# Patient Record
Sex: Female | Born: 1940 | Race: White | Hispanic: No | Smoking: Never smoker
Health system: Southern US, Community
[De-identification: ages and names within clinical notes are randomized; demographics above are authoritative.]

## PROBLEM LIST (undated history)

## (undated) DIAGNOSIS — J302 Other seasonal allergic rhinitis: Secondary | ICD-10-CM

## (undated) DIAGNOSIS — Z974 Presence of external hearing-aid: Secondary | ICD-10-CM

## (undated) DIAGNOSIS — M8580 Other specified disorders of bone density and structure, unspecified site: Secondary | ICD-10-CM

## (undated) DIAGNOSIS — I10 Essential (primary) hypertension: Secondary | ICD-10-CM

## (undated) HISTORY — PX: FOOT SURGERY: SHX648

## (undated) HISTORY — PX: VAGINA SURGERY: SHX829

## (undated) HISTORY — PX: CHOLECYSTECTOMY: SHX55

---

## 1998-11-21 ENCOUNTER — Other Ambulatory Visit: Admission: RE | Admit: 1998-11-21 | Discharge: 1998-11-21 | Payer: Self-pay | Admitting: Obstetrics & Gynecology

## 2000-01-01 ENCOUNTER — Other Ambulatory Visit: Admission: RE | Admit: 2000-01-01 | Discharge: 2000-01-01 | Payer: Self-pay | Admitting: Obstetrics & Gynecology

## 2000-01-24 ENCOUNTER — Ambulatory Visit (HOSPITAL_COMMUNITY): Admission: RE | Admit: 2000-01-24 | Discharge: 2000-01-24 | Payer: Self-pay | Admitting: Obstetrics & Gynecology

## 2000-03-04 ENCOUNTER — Ambulatory Visit (HOSPITAL_COMMUNITY): Admission: RE | Admit: 2000-03-04 | Discharge: 2000-03-04 | Payer: Self-pay | Admitting: Gastroenterology

## 2001-01-01 ENCOUNTER — Other Ambulatory Visit: Admission: RE | Admit: 2001-01-01 | Discharge: 2001-01-01 | Payer: Self-pay | Admitting: Obstetrics & Gynecology

## 2002-01-17 ENCOUNTER — Other Ambulatory Visit: Admission: RE | Admit: 2002-01-17 | Discharge: 2002-01-17 | Payer: Self-pay | Admitting: Obstetrics & Gynecology

## 2003-02-01 ENCOUNTER — Other Ambulatory Visit: Admission: RE | Admit: 2003-02-01 | Discharge: 2003-02-01 | Payer: Self-pay | Admitting: Obstetrics & Gynecology

## 2004-02-13 ENCOUNTER — Other Ambulatory Visit: Admission: RE | Admit: 2004-02-13 | Discharge: 2004-02-13 | Payer: Self-pay | Admitting: Obstetrics & Gynecology

## 2004-02-21 ENCOUNTER — Encounter: Admission: RE | Admit: 2004-02-21 | Discharge: 2004-02-21 | Payer: Self-pay | Admitting: Obstetrics & Gynecology

## 2005-07-07 ENCOUNTER — Other Ambulatory Visit: Admission: RE | Admit: 2005-07-07 | Discharge: 2005-07-07 | Payer: Self-pay | Admitting: Obstetrics & Gynecology

## 2010-05-20 ENCOUNTER — Ambulatory Visit: Payer: Self-pay | Admitting: Gastroenterology

## 2011-06-19 ENCOUNTER — Other Ambulatory Visit: Payer: Self-pay | Admitting: Obstetrics & Gynecology

## 2011-06-19 DIAGNOSIS — R928 Other abnormal and inconclusive findings on diagnostic imaging of breast: Secondary | ICD-10-CM

## 2011-07-01 ENCOUNTER — Ambulatory Visit
Admission: RE | Admit: 2011-07-01 | Discharge: 2011-07-01 | Disposition: A | Discharge status: Home / Self Care | Payer: Medicare Other | Source: Ambulatory Visit | Attending: Obstetrics & Gynecology | Admitting: Obstetrics & Gynecology

## 2011-07-01 DIAGNOSIS — R928 Other abnormal and inconclusive findings on diagnostic imaging of breast: Secondary | ICD-10-CM

## 2011-09-05 ENCOUNTER — Ambulatory Visit: Payer: Self-pay

## 2011-09-06 ENCOUNTER — Inpatient Hospital Stay: Payer: Self-pay | Admitting: Surgery

## 2011-09-25 ENCOUNTER — Ambulatory Visit: Payer: Self-pay

## 2011-11-05 ENCOUNTER — Ambulatory Visit: Payer: Self-pay | Admitting: Specialist

## 2011-11-05 LAB — PROTIME-INR: INR: 1

## 2012-08-28 IMAGING — CT CT CHEST W/ CM
1 series · 15 of 33 positions shown, 19 images · IV contrast (agent unspecified)
Comparison: none

REASON FOR EXAM: pneumothorax  CR  [id number]
COMMENTS:

PROCEDURE:     CT  - CT CHEST WITH CONTRAST  - September 25, 2011 [DATE]
RESULT:     Chest CT dated 09/25/2011.
TECHNIQUE: Helical 3 mm sections were obtained from the thoracic inlet
through the lung bases status post intravenous administration of 75 mL of
[id number].

[Series 4: soft tissue · axial · 0.57mm/px · z∈[+458,+713]mm · 15 of 101 slices shown, 19 images]
[im 8/101  mediastinal]
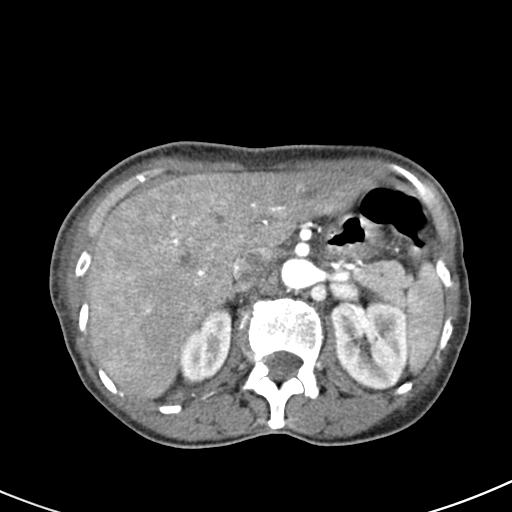
[im 8/101  lung]
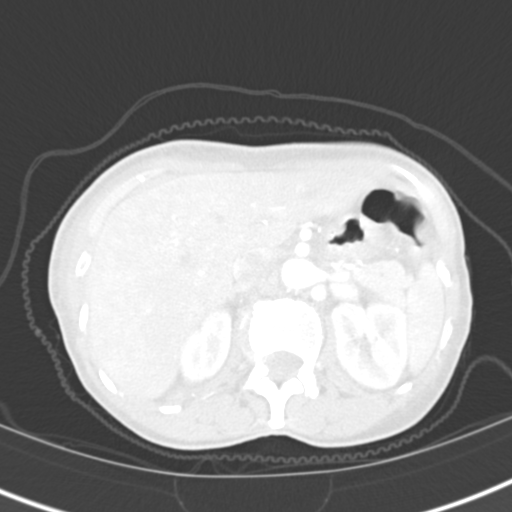
[im 15/101  lung]
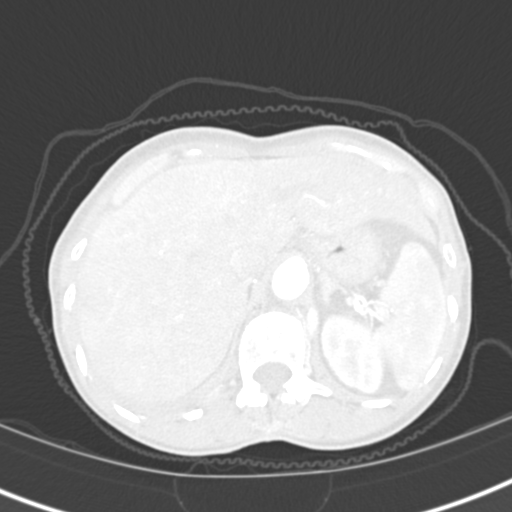
[im 21/101  lung]
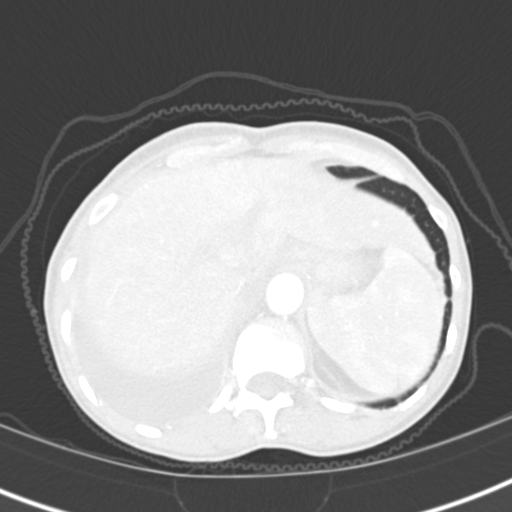
[im 26/101  lung]
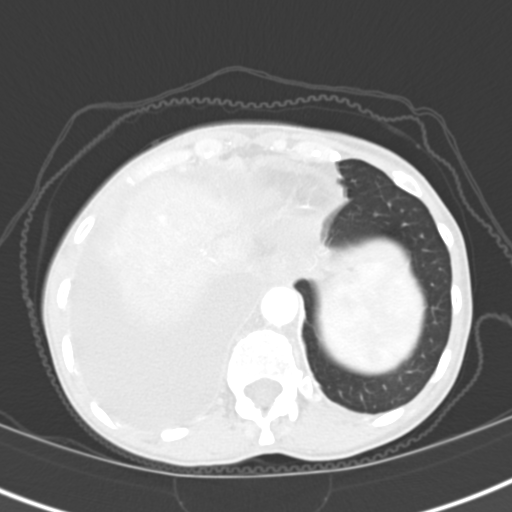
[im 34/101  mediastinal]
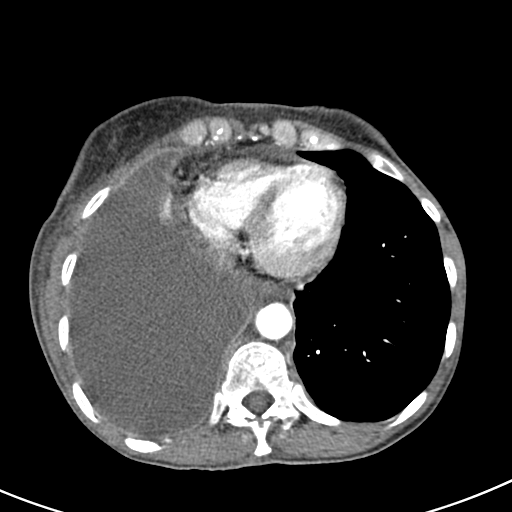
[im 34/101  lung]
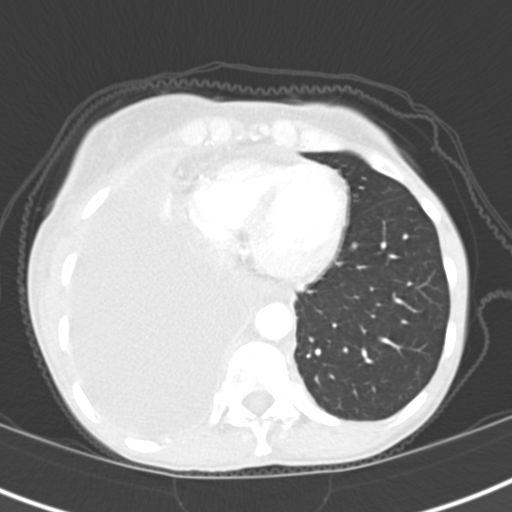
[im 41/101  lung]
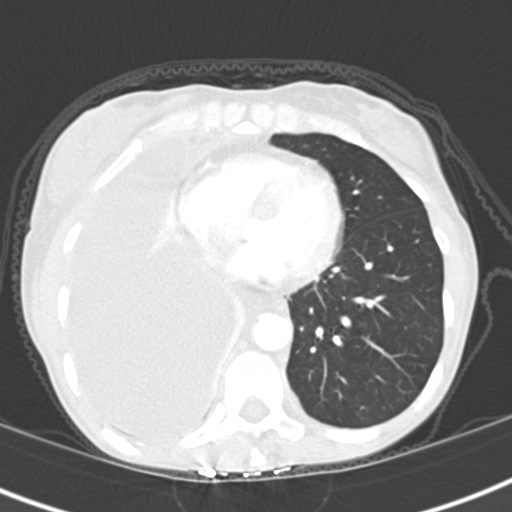
[im 48/101  lung]
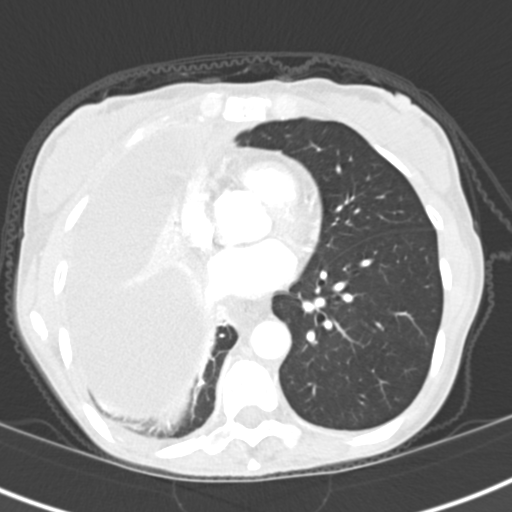
[im 52/101  lung]
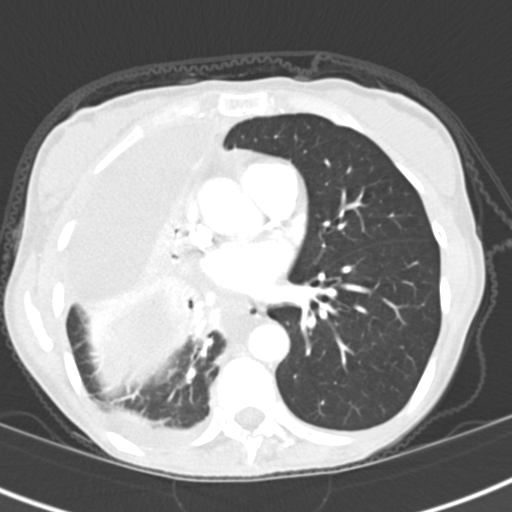
[im 56/101  mediastinal]
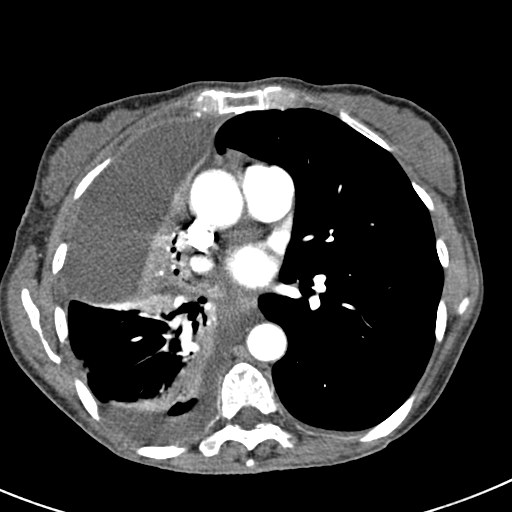
[im 56/101  lung]
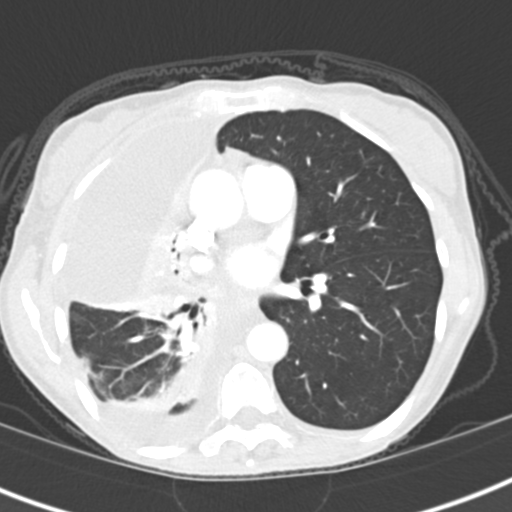
[im 61/101  lung]
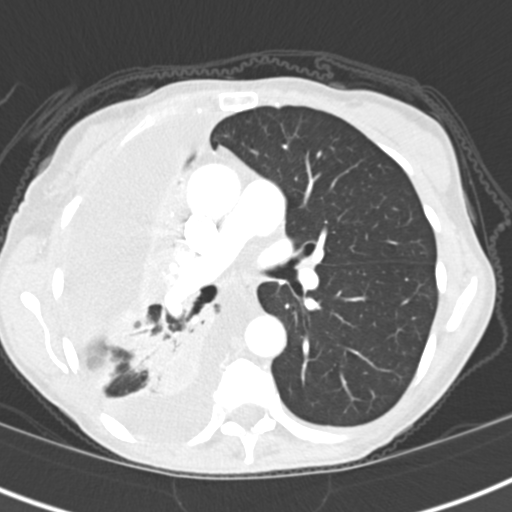
[im 67/101  lung]
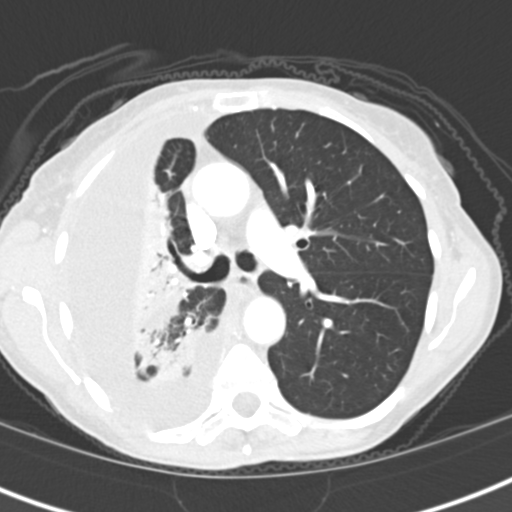
[im 75/101  lung]
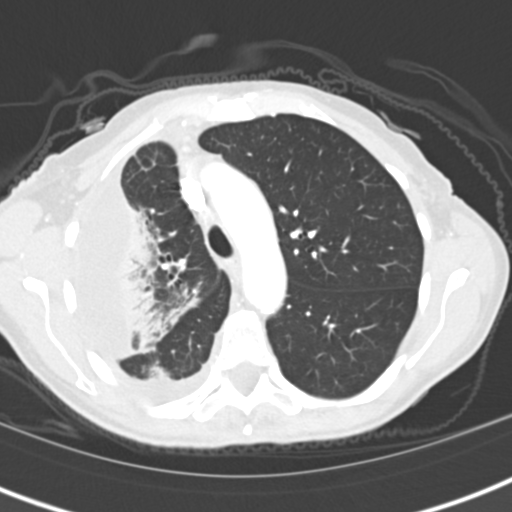
[im 81/101  mediastinal]
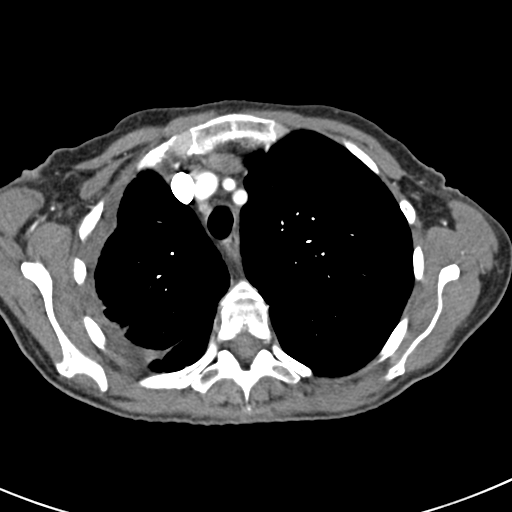
[im 81/101  lung]
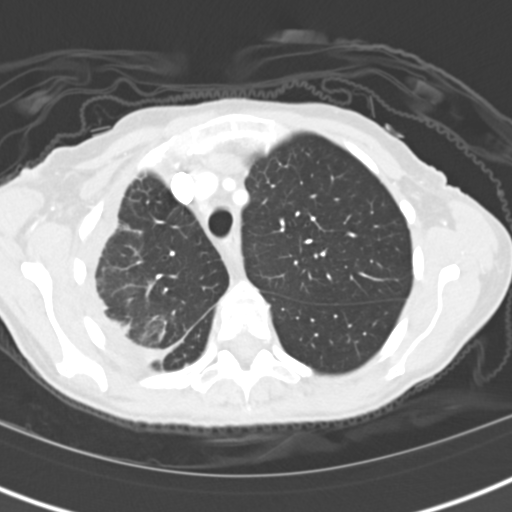
[im 86/101  lung]
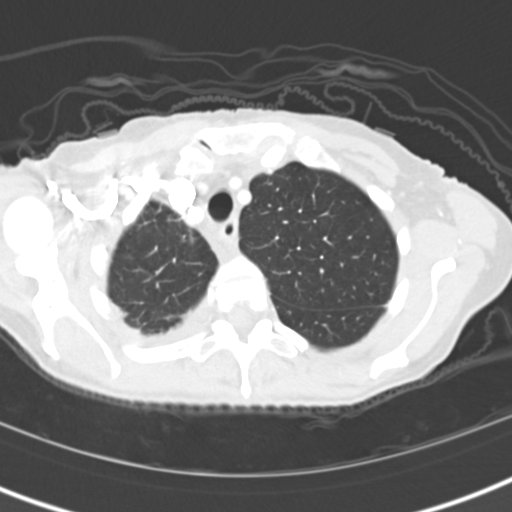
[im 93/101  lung]
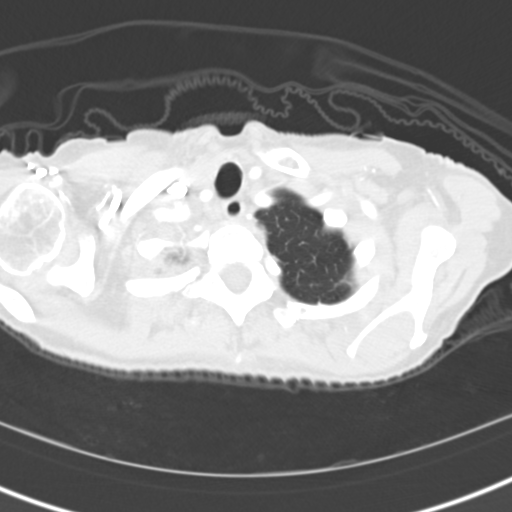

[15 of 33 positions shown; findings below may reference images not displayed]

FINDINGS: Volume loss is appreciated within the right hemithorax. There is
shift of mediastinal structures to the right. A large right pleural effusion
is appreciated with a component of loculation along the anterior midportion
of the right hemithorax. Areas of consolidation versus atelectasis project
within the medial anterior and basal portions of the right hemithorax. The
aerated portions of the lung demonstrate emphysematous changes. There is no
CT evidence of a pneumothorax. The left hemithorax demonstrates no evidence
of focal infiltrates, effusions, nor edema.

The visualized upper abdominal viscera demonstrate heterogeneous enhancement
of the liver a component of which may reflect the phase of contrast. Changes
secondary to liver parenchymal disease cannot be excluded and if clinically
warranted dedicated CT and/or MRI is recommended. The remaining visualized
upper abdominal viscera demonstrate no further gross abnormalities. Note a
subcentimeter small peripheral low attenuating focus projects along the
anterior aspect of the left lobe of the liver this has a nonspecific
appearance versus a small cyst.
IMPRESSION: Large right pleural effusion.
2. Consolidative areas of lung parenchymal within the right hemithorax these
areas may represent atelectasis versus infiltrates.
3. No evidence of pneumothorax nor pulmonary arterial embolic disease.
4. Possibly cirrhotic changes within the liver as described above

## 2012-10-08 IMAGING — US US GUIDE NEEDLE - US [PERSON_NAME]
1 series · 17 of 18 positions shown · non-contrast
Comparison: none

REASON FOR EXAM: RT pleural effusion
COMMENTS:

PROCEDURE:     US  - US GUIDED THORACENTESIS RIGHT  - November 05, 2011  [DATE]
RESULT:     Ultrasound was obtained of the right chest. A very echodense,
complex, pleural-based process is present. No free pleural fluid is noted.
Thoracentesis was not performed.

[Series 1: us guide needle - us (person_name) · 17 of 18 slices shown]
[im 1/18]
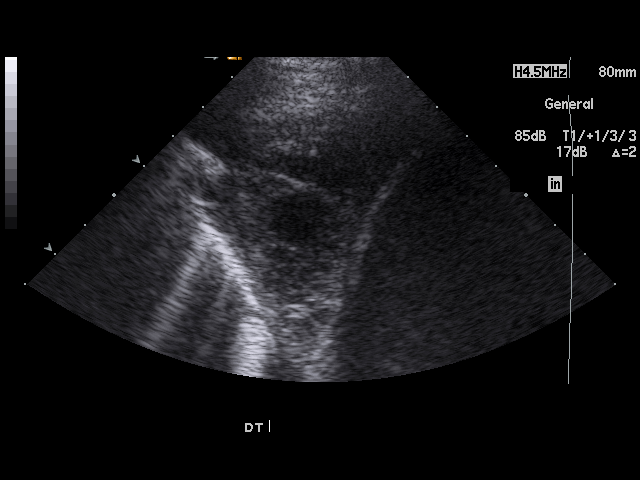
[im 2/18]
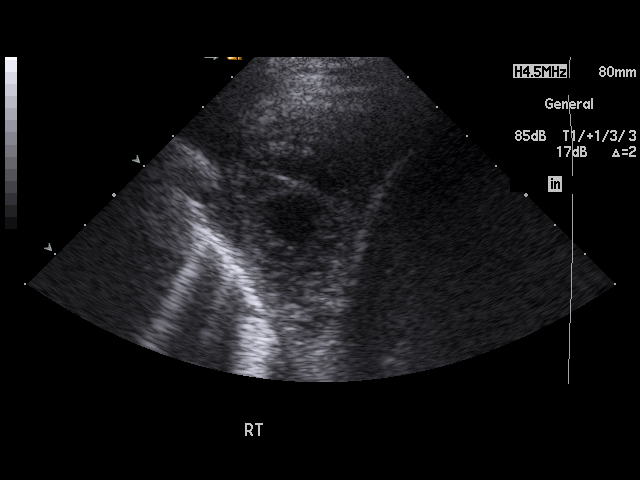
[im 3/18]
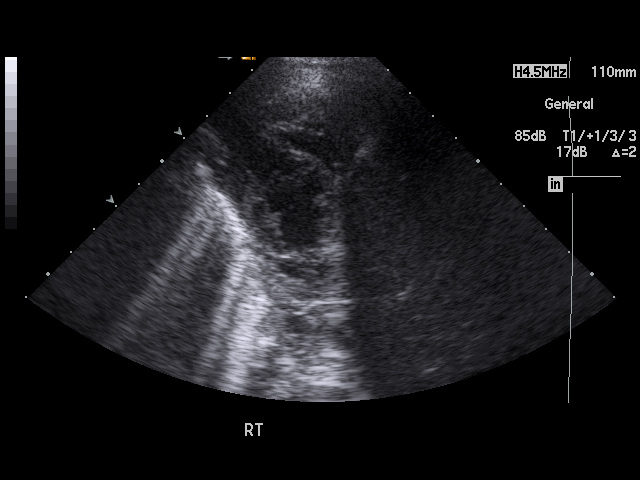
[im 4/18]
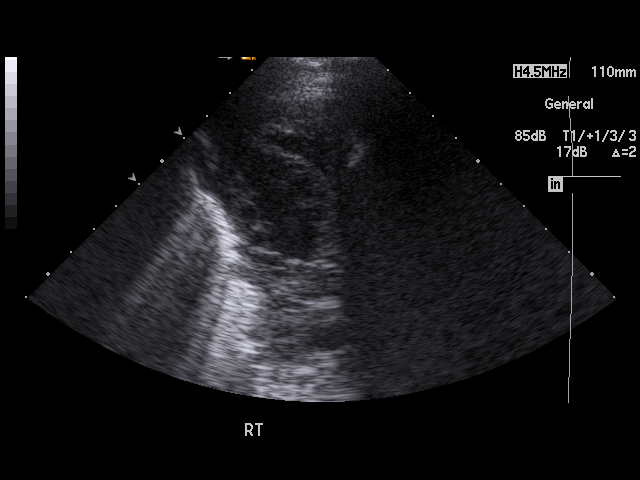
[im 5/18]
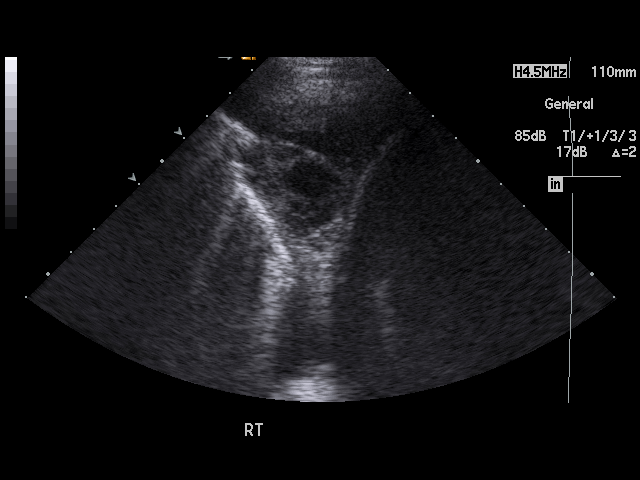
[im 6/18]
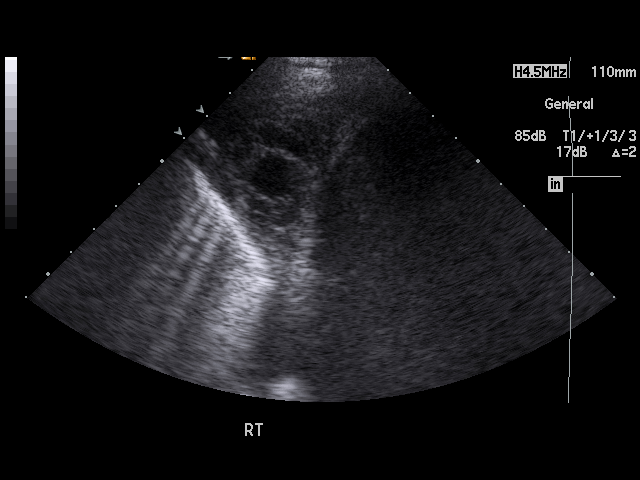
[im 7/18]
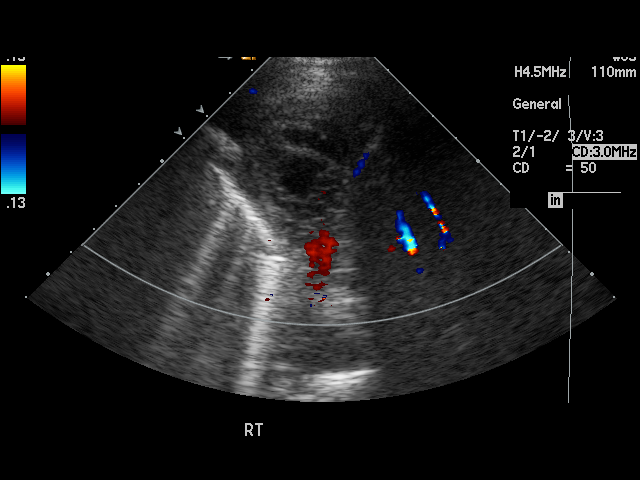
[im 8/18]
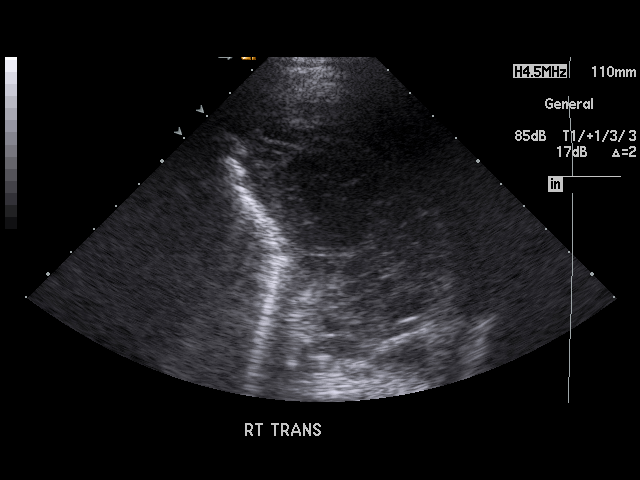
[im 10/18]
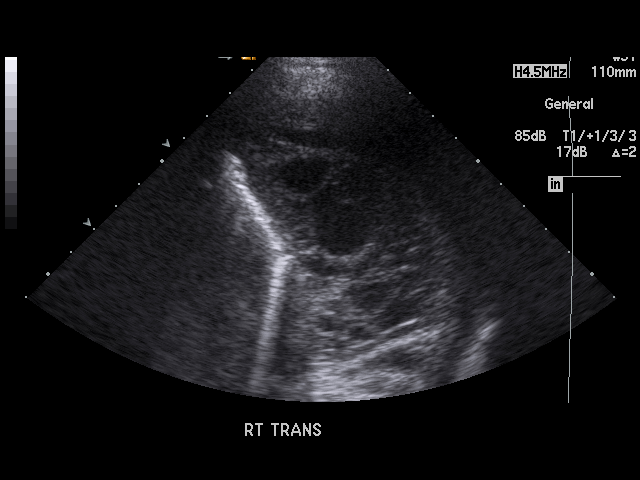
[im 11/18]
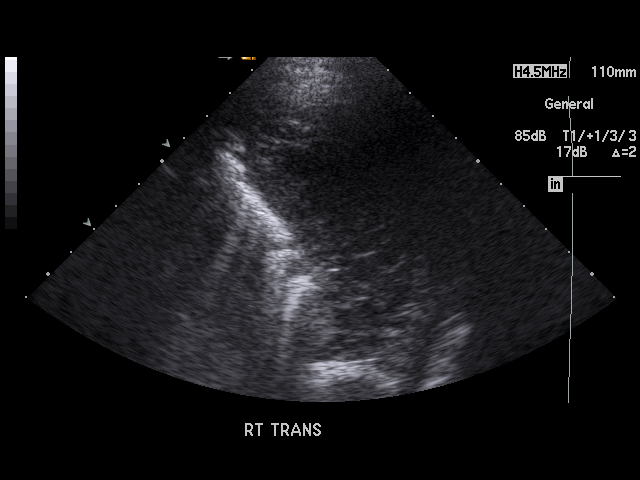
[im 12/18]
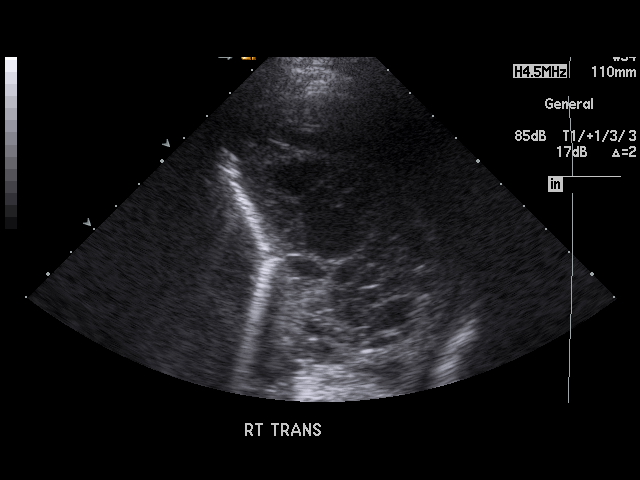
[im 13/18]
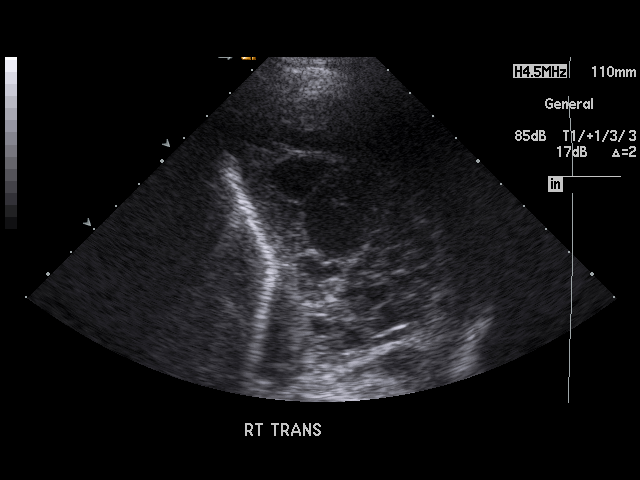
[im 14/18]
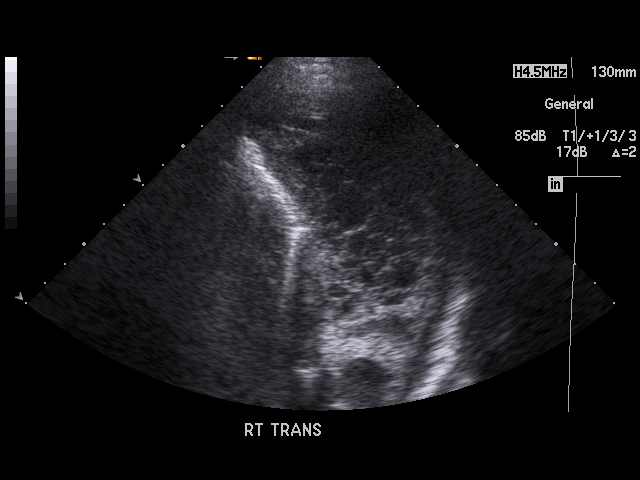
[im 15/18]
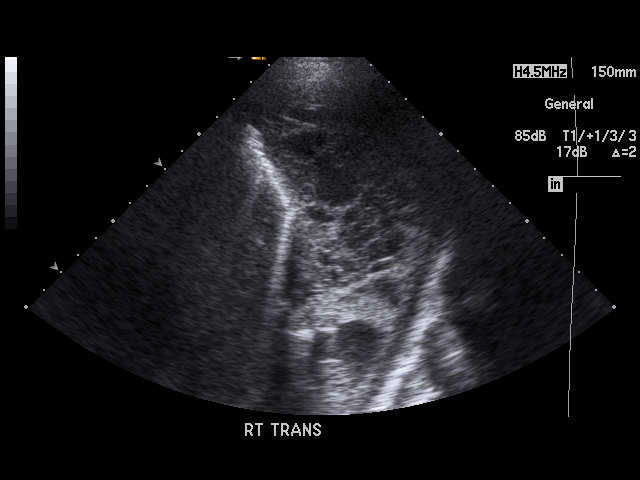
[im 16/18]
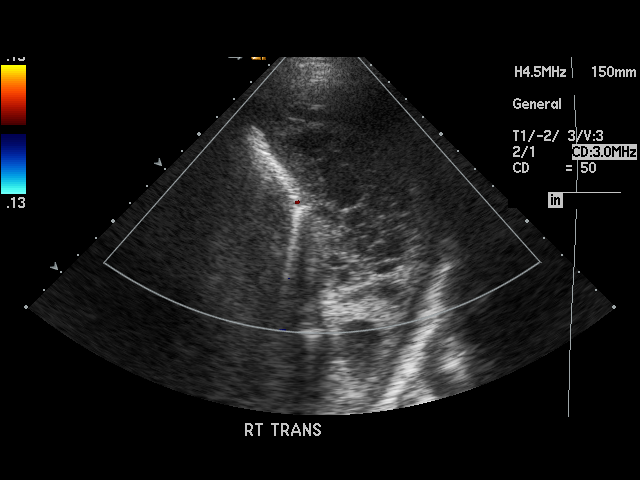
[im 17/18]
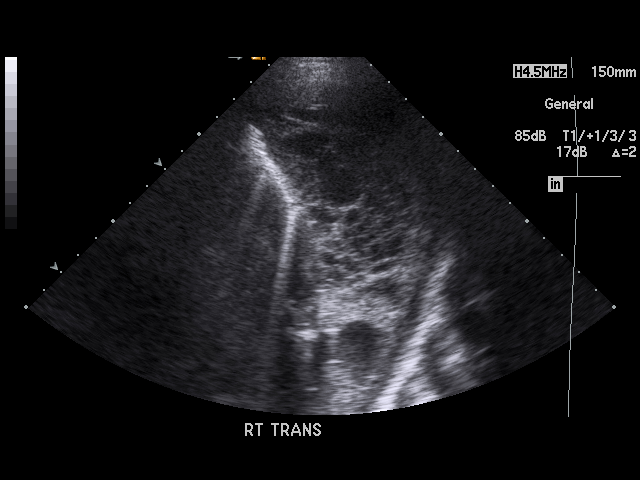
[im 18/18]
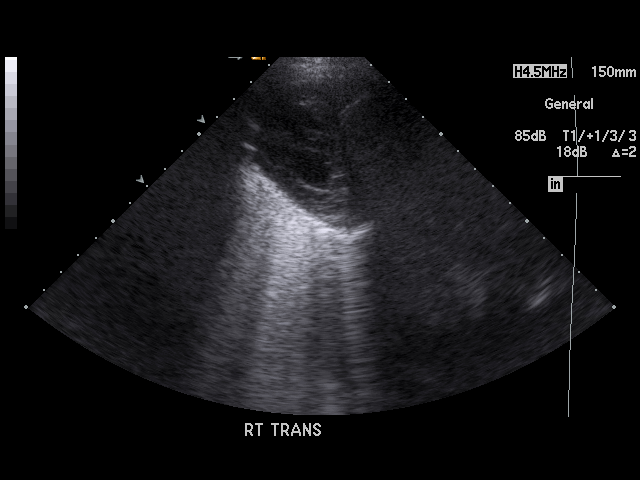

[17 of 18 positions shown; findings below may reference images not displayed]

IMPRESSION: 1. Very echodense, pleural-based process, no free fluid. Thoracentesis was
not performed. It is suggested that follow-up chest x-rays be obtained to
demonstrate resolution of the pleural-based process.

## 2015-01-14 NOTE — Op Note (Signed)
PATIENT NAME:  Laurie Ingram, Laurie Ingram MR#:  [id number] DATE OF BIRTH:  Apr 11, 1941  DATE OF PROCEDURE:  09/06/2011  PREOPERATIVE DIAGNOSIS: Acute cholecystitis.   POSTOPERATIVE DIAGNOSIS: Acute cholecystitis.   PROCEDURE: Laparoscopic cystectomy, cholangiogram.   SURGEON: Loreli Dollar, MD  ANESTHESIA: General.   INDICATIONS: This 74 year old female came in with a one-week history of abdominal pain, decreased oral intake ultrasound findings of marked thickening of the gallbladder wall. She had right abdominal tenderness, elevated white blood count. Surgery was recommended for definitive treatment.   DESCRIPTION OF PROCEDURE: The patient was placed on the operating table in the supine position under general endotracheal anesthesia. The abdomen was prepared with ChloraPrep, draped in a sterile manner.   A short incision was made about an inch below the umbilicus and carried down to the deep fascia which was grasped with laryngeal hook and elevated. A Veress needle was inserted, aspirated and irrigated with a saline solution. Next, the peritoneal cavity was inflated with carbon dioxide. The Veress needle was removed. The 10 mm cannula was inserted. The 10 mm 0 degrees laparoscope was inserted to view the peritoneal cavity. Another incision was made in the epigastrium just to the right of the midline to introduce another 11 mm cannula and two 5 mm cannulas were inserted through small incisions in the lateral aspect of the right upper quadrant. The liver appeared normal. The gallbladder was acutely inflamed with edematous wall and was distended. The gallbladder was retracted towards the right shoulder. Multiple adhesions were taken down with blunt and sharp dissection. The infundibulum was retracted inferiorly and laterally. The porta hepatis was demonstrated. The cystic duct was dissected free from surrounding structures. The cystic artery was dissected free from surrounding structures. The neck of the  gallbladder was mobilized with incision of the visceral peritoneum. A critical view of safety was demonstrated. Next, the cystic artery was controlled with double endoclips and divided which allowed more traction on the cystic duct. An endoclip was placed across the cystic duct adjacent to the neck of the gallbladder. Incision was made in the cystic duct to introduce a Reddick catheter. Half-strength Conray-60 dye was injected as the cholangiogram was done with fluoroscopy demonstrating the biliary tree and prompt flow of dye into the duodenum. No retained stones were seen. The Reddick catheter was removed. The cystic duct was doubly ligated with endoclips and divided. The gallbladder was dissected free from the liver with hook and cautery. There was marked edema of the gallbladder wall. Bleeding was very scant. The gallbladder was delivered up through the infraumbilical incision, opened, and suctioned and removed. It did have a nodule in its distal end, possible adenoma and was submitted in formalin for routine pathology. The right upper quadrant was further inspected, irrigated, and aspirated. Hemostasis was intact. The cannulas were removed allowing carbon dioxide to escape from the peritoneal cavity. Next, the fascial defect at the infraumbilical incision and the epigastric incision were closed with 2-0 chromic simple sutures. Next, the four incisions were closed with interrupted 5-0 chromic subcuticular sutures, benzoin, and Steri-Strips. Dressings were applied with paper tape. The patient tolerated surgery satisfactorily and now being prepared for transfer to the recovery room.   ____________________________ Lenna Sciara. Rochel Brome, MD jws:cms D: 09/06/2011 09:55:03 ET T: 09/06/2011 11:19:25 ET JOB#: [id number]  cc: Loreli Dollar, MD, <Dictator> Loreli Dollar MD ELECTRONICALLY SIGNED 09/27/2011 17:35

## 2019-12-21 ENCOUNTER — Other Ambulatory Visit: Payer: Self-pay

## 2019-12-21 ENCOUNTER — Encounter: Payer: Self-pay | Admitting: Ophthalmology

## 2019-12-29 NOTE — Anesthesia Preprocedure Evaluation (Addendum)
Anesthesia Evaluation  Patient identified by MRN, date of birth, ID band Patient awake    Reviewed: Allergy & Precautions, NPO status , Patient's Chart, lab work & pertinent test results, reviewed documented beta blocker date and time   History of Anesthesia Complications Negative for: history of anesthetic complications  Airway Mallampati: II  TM Distance: >3 FB Neck ROM: Limited    Dental   Pulmonary    breath sounds clear to auscultation       Cardiovascular hypertension, (-) angina(-) DOE  Rhythm:Regular Rate:Normal     Neuro/Psych    GI/Hepatic neg GERD  ,  Endo/Other    Renal/GU      Musculoskeletal   Abdominal   Peds  Hematology   Anesthesia Other Findings   Reproductive/Obstetrics                            Anesthesia Physical Anesthesia Plan  ASA: II  Anesthesia Plan: MAC   Post-op Pain Management:    Induction: Intravenous  PONV Risk Score and Plan: 2 and TIVA, Midazolam and Treatment may vary due to age or medical condition  Airway Management Planned: Nasal Cannula  Additional Equipment:   Intra-op Plan:   Post-operative Plan:   Informed Consent: I have reviewed the patients History and Physical, chart, labs and discussed the procedure including the risks, benefits and alternatives for the proposed anesthesia with the patient or authorized representative who has indicated his/her understanding and acceptance.       Plan Discussed with: CRNA and Anesthesiologist  Anesthesia Plan Comments:         Anesthesia Quick Evaluation

## 2019-12-30 ENCOUNTER — Other Ambulatory Visit
Admission: RE | Admit: 2019-12-30 | Discharge: 2019-12-30 | Disposition: A | Discharge status: Home / Self Care | Payer: Medicare Other | Source: Ambulatory Visit | Attending: Ophthalmology | Admitting: Ophthalmology

## 2019-12-30 DIAGNOSIS — Z20822 Contact with and (suspected) exposure to covid-19: Secondary | ICD-10-CM | POA: Insufficient documentation

## 2019-12-30 DIAGNOSIS — Z01812 Encounter for preprocedural laboratory examination: Secondary | ICD-10-CM | POA: Diagnosis present

## 2019-12-31 LAB — SARS CORONAVIRUS 2 (TAT 6-24 HRS): SARS Coronavirus 2: NEGATIVE

## 2020-01-02 NOTE — Discharge Instructions (Signed)

## 2020-01-03 ENCOUNTER — Other Ambulatory Visit: Payer: Self-pay

## 2020-01-03 ENCOUNTER — Encounter
Admission: RE | Disposition: A | Discharge status: Home / Self Care | Payer: Self-pay | Source: Home / Self Care | Attending: Ophthalmology

## 2020-01-03 ENCOUNTER — Encounter: Payer: Self-pay | Admitting: Ophthalmology

## 2020-01-03 ENCOUNTER — Ambulatory Visit: Payer: Medicare Other | Admitting: Anesthesiology

## 2020-01-03 ENCOUNTER — Ambulatory Visit
Admission: RE | Admit: 2020-01-03 | Discharge: 2020-01-03 | Disposition: A | Discharge status: Home / Self Care | Payer: Medicare Other | Attending: Ophthalmology | Admitting: Ophthalmology

## 2020-01-03 DIAGNOSIS — Z79899 Other long term (current) drug therapy: Secondary | ICD-10-CM | POA: Insufficient documentation

## 2020-01-03 DIAGNOSIS — Z7982 Long term (current) use of aspirin: Secondary | ICD-10-CM | POA: Diagnosis not present

## 2020-01-03 DIAGNOSIS — H2512 Age-related nuclear cataract, left eye: Secondary | ICD-10-CM | POA: Insufficient documentation

## 2020-01-03 DIAGNOSIS — I10 Essential (primary) hypertension: Secondary | ICD-10-CM | POA: Insufficient documentation

## 2020-01-03 DIAGNOSIS — H9190 Unspecified hearing loss, unspecified ear: Secondary | ICD-10-CM | POA: Insufficient documentation

## 2020-01-03 HISTORY — DX: Other specified disorders of bone density and structure, unspecified site: M85.80

## 2020-01-03 HISTORY — DX: Essential (primary) hypertension: I10

## 2020-01-03 HISTORY — DX: Presence of external hearing-aid: Z97.4

## 2020-01-03 HISTORY — PX: CATARACT EXTRACTION W/PHACO: SHX586

## 2020-01-03 HISTORY — DX: Other seasonal allergic rhinitis: J30.2

## 2020-01-03 SURGERY — PHACOEMULSIFICATION, CATARACT, WITH IOL INSERTION
Anesthesia: Monitor Anesthesia Care | Site: Eye | Laterality: Left

## 2020-01-03 MED ORDER — BRIMONIDINE TARTRATE-TIMOLOL 0.2-0.5 % OP SOLN
OPHTHALMIC | Status: DC | PRN
  Administered 2020-01-03: 1 [drp] via OPHTHALMIC

## 2020-01-03 MED ORDER — LIDOCAINE HCL (PF) 2 % IJ SOLN
INTRAOCULAR | Status: DC | PRN
  Administered 2020-01-03: 1 mL

## 2020-01-03 MED ORDER — EPINEPHRINE PF 1 MG/ML IJ SOLN
INTRAOCULAR | Status: DC | PRN
  Administered 2020-01-03: 11:00:00 53 mL via OPHTHALMIC

## 2020-01-03 MED ORDER — NA CHONDROIT SULF-NA HYALURON 40-17 MG/ML IO SOLN
INTRAOCULAR | Status: DC | PRN
  Administered 2020-01-03: 1 mL via INTRAOCULAR

## 2020-01-03 MED ORDER — ARMC OPHTHALMIC DILATING DROPS
1.0000 "application " | OPHTHALMIC | Status: DC | PRN
  Administered 2020-01-03 (×3): 1 via OPHTHALMIC

## 2020-01-03 MED ORDER — MIDAZOLAM HCL 2 MG/2ML IJ SOLN
INTRAMUSCULAR | Status: DC | PRN
  Administered 2020-01-03: 1 mg via INTRAVENOUS

## 2020-01-03 MED ORDER — TETRACAINE HCL 0.5 % OP SOLN
1.0000 [drp] | OPHTHALMIC | Status: DC | PRN
  Administered 2020-01-03 (×3): 1 [drp] via OPHTHALMIC

## 2020-01-03 MED ORDER — MOXIFLOXACIN HCL 0.5 % OP SOLN
OPHTHALMIC | Status: DC | PRN
  Administered 2020-01-03: 0.2 mL via OPHTHALMIC

## 2020-01-03 MED ORDER — LACTATED RINGERS IV SOLN: 100.0000 mL/h | INTRAVENOUS | Status: DC

## 2020-01-03 MED ORDER — FENTANYL CITRATE (PF) 100 MCG/2ML IJ SOLN
INTRAMUSCULAR | Status: DC | PRN
  Administered 2020-01-03: 50 ug via INTRAVENOUS

## 2020-01-03 MED ORDER — METOPROLOL TARTRATE 5 MG/5ML IV SOLN
2.5000 mg | Freq: Once | INTRAVENOUS | Status: AC
  Administered 2020-01-03: 2.5 mg via INTRAVENOUS

## 2020-01-03 SURGICAL SUPPLY — 22 items
CANNULA ANT/CHMB 27G (MISCELLANEOUS) ×2 IMPLANT
CANNULA ANT/CHMB 27GA (MISCELLANEOUS) ×4 IMPLANT
CARTRIDGE ABBOTT (MISCELLANEOUS) ×1 IMPLANT
DISSECTOR HYDRO NUCLEUS 50X22 (MISCELLANEOUS) ×2 IMPLANT
GLOVE SURG LX 8.0 MICRO (GLOVE) ×1
GLOVE SURG LX STRL 8.0 MICRO (GLOVE) ×1 IMPLANT
GLOVE SURG TRIUMPH 8.0 PF LTX (GLOVE) ×2 IMPLANT
GOWN STRL REUS W/ TWL LRG LVL3 (GOWN DISPOSABLE) ×2 IMPLANT
GOWN STRL REUS W/TWL LRG LVL3 (GOWN DISPOSABLE) ×4
LENS IOL DIOP 23.0 (Intraocular Lens) ×2 IMPLANT
LENS IOL TECNIS MONO 23.0 (Intraocular Lens) IMPLANT
MARKER SKIN DUAL TIP RULER LAB (MISCELLANEOUS) ×2 IMPLANT
NDL FILTER BLUNT 18X1 1/2 (NEEDLE) ×1 IMPLANT
NEEDLE FILTER BLUNT 18X 1/2SAF (NEEDLE) ×1
NEEDLE FILTER BLUNT 18X1 1/2 (NEEDLE) ×1 IMPLANT
PACK EYE AFTER SURG (MISCELLANEOUS) ×2 IMPLANT
PACK OPTHALMIC (MISCELLANEOUS) ×2 IMPLANT
PACK PORFILIO (MISCELLANEOUS) ×2 IMPLANT
SYR 3ML LL SCALE MARK (SYRINGE) ×2 IMPLANT
SYR TB 1ML LUER SLIP (SYRINGE) ×2 IMPLANT
WATER STERILE IRR 250ML POUR (IV SOLUTION) ×2 IMPLANT
WIPE NON LINTING 3.25X3.25 (MISCELLANEOUS) ×2 IMPLANT

## 2020-01-03 NOTE — Anesthesia Postprocedure Evaluation (Signed)
Anesthesia Post Note  Patient: Laurie Ingram  Procedure(s) Performed: CATARACT EXTRACTION PHACO AND INTRAOCULAR LENS PLACEMENT (IOC) LEFT 6.33  00:43.6 (Left Eye)     Patient location during evaluation: PACU Anesthesia Type: MAC Level of consciousness: awake and alert Pain management: pain level controlled Vital Signs Assessment: post-procedure vital signs reviewed and stable Respiratory status: spontaneous breathing, nonlabored ventilation, respiratory function stable and patient connected to nasal cannula oxygen Cardiovascular status: stable and blood pressure returned to baseline Postop Assessment: no apparent nausea or vomiting Anesthetic complications: no    Kohlton Gilpatrick A  Kase Shughart

## 2020-01-03 NOTE — Op Note (Signed)
PREOPERATIVE DIAGNOSIS:  Nuclear sclerotic cataract of the left eye.   POSTOPERATIVE DIAGNOSIS:  Nuclear sclerotic cataract of the left eye.   OPERATIVE PROCEDURE:@   SURGEON:  Birder Robson, MD.   ANESTHESIA:  Anesthesiologist: Heniser, Fredric Dine, MD CRNA: Vanetta Shawl, CRNA  1.      Managed anesthesia care. 2.     0.27ml of Shugarcaine was instilled following the paracentesis   COMPLICATIONS:  None.   TECHNIQUE:   Stop and chop   DESCRIPTION OF PROCEDURE:  The patient was examined and consented in the preoperative holding area where the aforementioned topical anesthesia was applied to the left eye and then brought back to the Operating Room where the left eye was prepped and draped in the usual sterile ophthalmic fashion and a lid speculum was placed. A paracentesis was created with the side port blade and the anterior chamber was filled with viscoelastic. A near clear corneal incision was performed with the steel keratome. A continuous curvilinear capsulorrhexis was performed with a cystotome followed by the capsulorrhexis forceps. Hydrodissection and hydrodelineation were carried out with BSS on a blunt cannula. The lens was removed in a stop and chop  technique and the remaining cortical material was removed with the irrigation-aspiration handpiece. The capsular bag was inflated with viscoelastic and the Technis [id number] lens was placed in the capsular bag without complication. The remaining viscoelastic was removed from the eye with the irrigation-aspiration handpiece. The wounds were hydrated. The anterior chamber was flushed with BSS and the eye was inflated to physiologic pressure. 0.44ml Vigamox was placed in the anterior chamber. The wounds were found to be water tight. The eye was dressed with Combigan. The patient was given protective glasses to wear throughout the day and a shield with which to sleep tonight. The patient was also given drops with which to begin a drop regimen today  and will follow-up with me in one day. Implant Name Type Inv. Item Serial No. Manufacturer Lot No. LRB No. Used Action  LENS IOL DIOP 23.0 - [id number] Intraocular Lens LENS IOL DIOP 23.0 [id number]  Left 1 Implanted    Procedure(s): CATARACT EXTRACTION PHACO AND INTRAOCULAR LENS PLACEMENT (IOC) LEFT 6.33  00:43.6 (Left)  Electronically signed: Birder Robson 01/03/2020 11:26 AM

## 2020-01-03 NOTE — Anesthesia Procedure Notes (Signed)
Procedure Name: MAC Date/Time: 01/03/2020 11:07 AM Performed by: Vanetta Shawl, CRNA Pre-anesthesia Checklist: Patient identified, Emergency Drugs available, Suction available, Timeout performed and Patient being monitored Patient Re-evaluated:Patient Re-evaluated prior to induction Oxygen Delivery Method: Nasal cannula Placement Confirmation: positive ETCO2

## 2020-01-03 NOTE — H&P (Signed)
All labs reviewed. Abnormal studies sent to patients PCP when indicated.  Previous H&P reviewed, patient examined, there are NO CHANGES.  Laurie Prestwood Porfilio4/13/202110:58 AM

## 2020-01-03 NOTE — Transfer of Care (Signed)
Immediate Anesthesia Transfer of Care Note  Patient: Laurie Ingram  Procedure(s) Performed: CATARACT EXTRACTION PHACO AND INTRAOCULAR LENS PLACEMENT (IOC) LEFT 6.33  00:43.6 (Left Eye)  Patient Location: PACU  Anesthesia Type: MAC  Level of Consciousness: awake, alert  and patient cooperative  Airway and Oxygen Therapy: Patient Spontanous Breathing   Post-op Assessment: Post-op Vital signs reviewed, Patient's Cardiovascular Status Stable, Respiratory Function Stable, Patent Airway and No signs of Nausea or vomiting  Post-op Vital Signs: Reviewed and stable  Complications: No apparent anesthesia complications

## 2020-01-04 ENCOUNTER — Encounter: Payer: Self-pay | Admitting: *Deleted

## 2020-01-16 ENCOUNTER — Other Ambulatory Visit: Payer: Self-pay

## 2020-01-16 ENCOUNTER — Encounter: Payer: Self-pay | Admitting: Ophthalmology

## 2020-01-20 ENCOUNTER — Other Ambulatory Visit: Payer: Self-pay

## 2020-01-20 ENCOUNTER — Other Ambulatory Visit
Admission: RE | Admit: 2020-01-20 | Discharge: 2020-01-20 | Disposition: A | Discharge status: Home / Self Care | Payer: Medicare Other | Source: Ambulatory Visit | Attending: Ophthalmology | Admitting: Ophthalmology

## 2020-01-20 DIAGNOSIS — Z01812 Encounter for preprocedural laboratory examination: Secondary | ICD-10-CM | POA: Diagnosis present

## 2020-01-20 DIAGNOSIS — Z20822 Contact with and (suspected) exposure to covid-19: Secondary | ICD-10-CM | POA: Diagnosis not present

## 2020-01-20 LAB — SARS CORONAVIRUS 2 (TAT 6-24 HRS): SARS Coronavirus 2: NEGATIVE

## 2020-01-20 NOTE — Discharge Instructions (Signed)

## 2020-01-24 ENCOUNTER — Encounter: Payer: Self-pay | Admitting: Ophthalmology

## 2020-01-24 ENCOUNTER — Other Ambulatory Visit: Payer: Self-pay

## 2020-01-24 ENCOUNTER — Ambulatory Visit: Payer: Medicare Other | Admitting: Anesthesiology

## 2020-01-24 ENCOUNTER — Encounter
Admission: RE | Disposition: A | Discharge status: Home / Self Care | Payer: Self-pay | Source: Home / Self Care | Attending: Ophthalmology

## 2020-01-24 ENCOUNTER — Ambulatory Visit
Admission: RE | Admit: 2020-01-24 | Discharge: 2020-01-24 | Disposition: A | Discharge status: Home / Self Care | Payer: Medicare Other | Attending: Ophthalmology | Admitting: Ophthalmology

## 2020-01-24 DIAGNOSIS — I10 Essential (primary) hypertension: Secondary | ICD-10-CM | POA: Diagnosis not present

## 2020-01-24 DIAGNOSIS — Z885 Allergy status to narcotic agent status: Secondary | ICD-10-CM | POA: Diagnosis not present

## 2020-01-24 DIAGNOSIS — H2511 Age-related nuclear cataract, right eye: Secondary | ICD-10-CM | POA: Diagnosis present

## 2020-01-24 DIAGNOSIS — Z888 Allergy status to other drugs, medicaments and biological substances status: Secondary | ICD-10-CM | POA: Diagnosis not present

## 2020-01-24 DIAGNOSIS — Z7982 Long term (current) use of aspirin: Secondary | ICD-10-CM | POA: Insufficient documentation

## 2020-01-24 DIAGNOSIS — Z882 Allergy status to sulfonamides status: Secondary | ICD-10-CM | POA: Insufficient documentation

## 2020-01-24 DIAGNOSIS — Z79899 Other long term (current) drug therapy: Secondary | ICD-10-CM | POA: Insufficient documentation

## 2020-01-24 HISTORY — PX: CATARACT EXTRACTION W/PHACO: SHX586

## 2020-01-24 SURGERY — PHACOEMULSIFICATION, CATARACT, WITH IOL INSERTION
Anesthesia: Monitor Anesthesia Care | Site: Eye | Laterality: Right

## 2020-01-24 MED ORDER — EPINEPHRINE PF 1 MG/ML IJ SOLN
INTRAOCULAR | Status: DC | PRN
  Administered 2020-01-24: 59 mL via OPHTHALMIC

## 2020-01-24 MED ORDER — LIDOCAINE HCL (PF) 2 % IJ SOLN
INTRAOCULAR | Status: DC | PRN
  Administered 2020-01-24: 1 mL

## 2020-01-24 MED ORDER — FENTANYL CITRATE (PF) 100 MCG/2ML IJ SOLN
INTRAMUSCULAR | Status: DC | PRN
  Administered 2020-01-24: 50 ug via INTRAVENOUS

## 2020-01-24 MED ORDER — TETRACAINE HCL 0.5 % OP SOLN
1.0000 [drp] | OPHTHALMIC | Status: DC | PRN
  Administered 2020-01-24 (×3): 1 [drp] via OPHTHALMIC

## 2020-01-24 MED ORDER — MOXIFLOXACIN HCL 0.5 % OP SOLN
OPHTHALMIC | Status: DC | PRN
  Administered 2020-01-24: 0.2 mL via OPHTHALMIC

## 2020-01-24 MED ORDER — ARMC OPHTHALMIC DILATING DROPS
1.0000 "application " | OPHTHALMIC | Status: DC | PRN
  Administered 2020-01-24 (×3): 1 via OPHTHALMIC

## 2020-01-24 MED ORDER — BRIMONIDINE TARTRATE-TIMOLOL 0.2-0.5 % OP SOLN
OPHTHALMIC | Status: DC | PRN
  Administered 2020-01-24: 1 [drp] via OPHTHALMIC

## 2020-01-24 MED ORDER — NA CHONDROIT SULF-NA HYALURON 40-17 MG/ML IO SOLN
INTRAOCULAR | Status: DC | PRN
  Administered 2020-01-24: 1 mL via INTRAOCULAR

## 2020-01-24 MED ORDER — MIDAZOLAM HCL 2 MG/2ML IJ SOLN
INTRAMUSCULAR | Status: DC | PRN
  Administered 2020-01-24: 1 mg via INTRAVENOUS

## 2020-01-24 SURGICAL SUPPLY — 19 items
CANNULA ANT/CHMB 27GA (MISCELLANEOUS) ×6 IMPLANT
DISSECTOR HYDRO NUCLEUS 50X22 (MISCELLANEOUS) ×3 IMPLANT
GLOVE SURG LX 8.0 MICRO (GLOVE) ×2
GLOVE SURG LX STRL 8.0 MICRO (GLOVE) ×1 IMPLANT
GLOVE SURG TRIUMPH 8.0 PF LTX (GLOVE) ×3 IMPLANT
GOWN STRL REUS W/ TWL LRG LVL3 (GOWN DISPOSABLE) ×2 IMPLANT
GOWN STRL REUS W/TWL LRG LVL3 (GOWN DISPOSABLE) ×6
LENS IOL DIOP 24.0 (Intraocular Lens) ×3 IMPLANT
LENS IOL TECNIS MONO 24.0 (Intraocular Lens) ×1 IMPLANT
MARKER SKIN DUAL TIP RULER LAB (MISCELLANEOUS) ×3 IMPLANT
NEEDLE FILTER BLUNT 18X 1/2SAF (NEEDLE) ×2
NEEDLE FILTER BLUNT 18X1 1/2 (NEEDLE) ×1 IMPLANT
PACK EYE AFTER SURG (MISCELLANEOUS) ×3 IMPLANT
PACK OPTHALMIC (MISCELLANEOUS) ×3 IMPLANT
PACK PORFILIO (MISCELLANEOUS) ×3 IMPLANT
SYR 3ML LL SCALE MARK (SYRINGE) ×3 IMPLANT
SYR TB 1ML LUER SLIP (SYRINGE) ×3 IMPLANT
WATER STERILE IRR 250ML POUR (IV SOLUTION) ×3 IMPLANT
WIPE NON LINTING 3.25X3.25 (MISCELLANEOUS) ×3 IMPLANT

## 2020-01-24 NOTE — Transfer of Care (Signed)
Immediate Anesthesia Transfer of Care Note  Patient: Laurie Ingram  Procedure(s) Performed: CATARACT EXTRACTION PHACO AND INTRAOCULAR LENS PLACEMENT (IOC) RIGHT 4.76  00:35.9 (Right Eye)  Patient Location: PACU  Anesthesia Type: MAC  Level of Consciousness: awake, alert  and patient cooperative  Airway and Oxygen Therapy: Patient Spontanous Breathing   Post-op Assessment: Post-op Vital signs reviewed, Patient's Cardiovascular Status Stable, Respiratory Function Stable, Patent Airway and No signs of Nausea or vomiting  Post-op Vital Signs: Reviewed and stable  Complications: No apparent anesthesia complications

## 2020-01-24 NOTE — Anesthesia Postprocedure Evaluation (Signed)
Anesthesia Post Note  Patient: Laurie Ingram  Procedure(s) Performed: CATARACT EXTRACTION PHACO AND INTRAOCULAR LENS PLACEMENT (IOC) RIGHT 4.76  00:35.9 (Right Eye)     Patient location during evaluation: PACU Anesthesia Type: MAC Level of consciousness: awake and alert Pain management: pain level controlled Vital Signs Assessment: post-procedure vital signs reviewed and stable Respiratory status: spontaneous breathing, nonlabored ventilation, respiratory function stable and patient connected to nasal cannula oxygen Cardiovascular status: stable and blood pressure returned to baseline Postop Assessment: no apparent nausea or vomiting Anesthetic complications: no    Ritisha Deitrick

## 2020-01-24 NOTE — Anesthesia Preprocedure Evaluation (Signed)
Anesthesia Evaluation  Patient identified by MRN, date of birth, ID band Patient awake    Reviewed: NPO status   History of Anesthesia Complications Negative for: history of anesthetic complications  Airway Mallampati: II  TM Distance: >3 FB Neck ROM: full    Dental no notable dental hx.    Pulmonary neg pulmonary ROS,    Pulmonary exam normal        Cardiovascular Exercise Tolerance: Good hypertension, Normal cardiovascular exam     Neuro/Psych HOH > Wears hearing aid in both ears negative psych ROS   GI/Hepatic negative GI ROS, Neg liver ROS,   Endo/Other  negative endocrine ROS  Renal/GU negative Renal ROS  negative genitourinary   Musculoskeletal   Abdominal   Peds  Hematology negative hematology ROS (+)   Anesthesia Other Findings Covid: NEG.  Reproductive/Obstetrics                             Anesthesia Physical Anesthesia Plan  ASA: II  Anesthesia Plan: MAC   Post-op Pain Management:    Induction:   PONV Risk Score and Plan: 2 and TIVA and Midazolam  Airway Management Planned:   Additional Equipment:   Intra-op Plan:   Post-operative Plan:   Informed Consent: I have reviewed the patients History and Physical, chart, labs and discussed the procedure including the risks, benefits and alternatives for the proposed anesthesia with the patient or authorized representative who has indicated his/her understanding and acceptance.       Plan Discussed with: CRNA  Anesthesia Plan Comments:         Anesthesia Quick Evaluation

## 2020-01-24 NOTE — Anesthesia Procedure Notes (Signed)
Procedure Name: MAC Date/Time: 01/24/2020 10:07 AM Performed by: Vanetta Shawl, CRNA Pre-anesthesia Checklist: Patient identified, Emergency Drugs available, Suction available, Timeout performed and Patient being monitored Patient Re-evaluated:Patient Re-evaluated prior to induction Oxygen Delivery Method: Nasal cannula Placement Confirmation: positive ETCO2

## 2020-01-24 NOTE — H&P (Signed)
All labs reviewed. Abnormal studies sent to patients PCP when indicated.  Previous H&P reviewed, patient examined, there are NO CHANGES.  Katrice Goel Porfilio5/4/20219:56 AM

## 2020-01-24 NOTE — Op Note (Signed)
PREOPERATIVE DIAGNOSIS:  Nuclear sclerotic cataract of the right eye.   POSTOPERATIVE DIAGNOSIS:  H25.11 Cataract   OPERATIVE PROCEDURE:@   SURGEON:  Birder Robson, MD.   ANESTHESIA:  Anesthesiologist: Fidel Levy, MD CRNA: Vanetta Shawl, CRNA  1.      Managed anesthesia care. 2.      0.42ml of Shugarcaine was instilled in the eye following the paracentesis.   COMPLICATIONS:  None.   TECHNIQUE:   Stop and chop   DESCRIPTION OF PROCEDURE:  The patient was examined and consented in the preoperative holding area where the aforementioned topical anesthesia was applied to the right eye and then brought back to the Operating Room where the right eye was prepped and draped in the usual sterile ophthalmic fashion and a lid speculum was placed. A paracentesis was created with the side port blade and the anterior chamber was filled with viscoelastic. A near clear corneal incision was performed with the steel keratome. A continuous curvilinear capsulorrhexis was performed with a cystotome followed by the capsulorrhexis forceps. Hydrodissection and hydrodelineation were carried out with BSS on a blunt cannula. The lens was removed in a stop and chop  technique and the remaining cortical material was removed with the irrigation-aspiration handpiece. The capsular bag was inflated with viscoelastic and the Technis [id number]  lens was placed in the capsular bag without complication. The remaining viscoelastic was removed from the eye with the irrigation-aspiration handpiece. The wounds were hydrated. The anterior chamber was flushed with BSS and the eye was inflated to physiologic pressure. 0.87ml of Vigamox was placed in the anterior chamber. The wounds were found to be water tight. The eye was dressed with Combigan. The patient was given protective glasses to wear throughout the day and a shield with which to sleep tonight. The patient was also given drops with which to begin a drop regimen today and will  follow-up with me in one day. Implant Name Type Inv. Item Serial No. Manufacturer Lot No. LRB No. Used Action  LENS IOL DIOP 24.0 - [id number] Intraocular Lens LENS IOL DIOP 24.0 [id number]  Right 1 Implanted   Procedure(s): CATARACT EXTRACTION PHACO AND INTRAOCULAR LENS PLACEMENT (IOC) RIGHT 4.76  00:35.9 (Right)  Electronically signed: Birder Robson 01/24/2020 10:27 AM

## 2020-01-25 ENCOUNTER — Encounter: Payer: Self-pay | Admitting: *Deleted

## 2021-01-04 ENCOUNTER — Ambulatory Visit: Payer: Medicare Other | Attending: Internal Medicine

## 2021-01-04 ENCOUNTER — Other Ambulatory Visit: Payer: Self-pay

## 2021-01-04 DIAGNOSIS — Z23 Encounter for immunization: Secondary | ICD-10-CM

## 2021-01-04 MED ORDER — PFIZER-BIONT COVID-19 VAC-TRIS 30 MCG/0.3ML IM SUSP
INTRAMUSCULAR | 0 refills | Status: AC
  Filled 2021-01-04: qty 0.3, 1d supply, fill #0

## 2021-01-04 NOTE — Progress Notes (Signed)
   Covid-19 Vaccination Clinic  Name:  WILMER BERRYHILL    MRN: [id number] DOB: [birth year]  01/04/2021  Ms. Coventry was observed post Covid-19 immunization for 15 minutes without incident. She was provided with Vaccine Information Sheet and instruction to access the V-Safe system.   Ms. Ikeda was instructed to call 911 with any severe reactions post vaccine: Marland Kitchen Difficulty breathing  . Swelling of face and throat  . A fast heartbeat  . A bad rash all over body  . Dizziness and weakness   Immunizations Administered    Name Date Dose VIS Date Route   PFIZER Comrnaty(Gray TOP) Covid-19 Vaccine 01/04/2021  9:17 AM 0.3 mL 08/30/2020 Intramuscular   Manufacturer: [employer]   Lot: [id number]   [place]: [phone]

## 2021-09-11 ENCOUNTER — Other Ambulatory Visit: Payer: Self-pay | Admitting: Internal Medicine

## 2021-09-11 DIAGNOSIS — N6310 Unspecified lump in the right breast, unspecified quadrant: Secondary | ICD-10-CM

## 2021-09-20 ENCOUNTER — Other Ambulatory Visit: Payer: Self-pay | Admitting: Internal Medicine

## 2021-09-20 ENCOUNTER — Ambulatory Visit
Admission: RE | Admit: 2021-09-20 | Discharge: 2021-09-20 | Disposition: A | Discharge status: Home / Self Care | Payer: Medicare Other | Source: Ambulatory Visit | Attending: Internal Medicine | Admitting: Internal Medicine

## 2021-09-20 ENCOUNTER — Other Ambulatory Visit: Payer: Self-pay

## 2021-09-20 DIAGNOSIS — N6310 Unspecified lump in the right breast, unspecified quadrant: Secondary | ICD-10-CM

## 2021-09-20 DIAGNOSIS — N6312 Unspecified lump in the right breast, upper inner quadrant: Secondary | ICD-10-CM | POA: Insufficient documentation

## 2021-09-20 DIAGNOSIS — R233 Spontaneous ecchymoses: Secondary | ICD-10-CM

## 2022-08-19 ENCOUNTER — Other Ambulatory Visit: Payer: Self-pay | Admitting: Internal Medicine

## 2022-08-19 DIAGNOSIS — Z1231 Encounter for screening mammogram for malignant neoplasm of breast: Secondary | ICD-10-CM

## 2022-08-21 ENCOUNTER — Ambulatory Visit: Payer: Medicare Other | Admitting: Dermatology

## 2022-08-21 DIAGNOSIS — L821 Other seborrheic keratosis: Secondary | ICD-10-CM

## 2022-08-21 DIAGNOSIS — D1801 Hemangioma of skin and subcutaneous tissue: Secondary | ICD-10-CM

## 2022-08-21 DIAGNOSIS — L814 Other melanin hyperpigmentation: Secondary | ICD-10-CM

## 2022-08-21 NOTE — Progress Notes (Signed)
   New Patient Visit  Subjective  OMEGA DURANTE is a 81 y.o. female who presents for the following: Skin Problem. The patient has lesions on her legs and face to be evaluated, some may be new or changing and the patient has concerns that these could be cancer.   The following portions of the chart were reviewed this encounter and updated as appropriate:   Tobacco  Allergies  Meds  Problems  Med Hx  Surg Hx  Fam Hx     Review of Systems:  No other skin or systemic complaints except as noted in HPI or Assessment and Plan.  Objective  Well appearing patient in no apparent distress; mood and affect are within normal limits.  A focused examination was performed including abdomen,right leg. Relevant physical exam findings are noted in the Assessment and Plan.  legs Scattered tan macules.    Assessment & Plan  Lentigines legs Lentigines - Scattered tan macules - Due to sun exposure - Benign-appearing, observe - Recommend daily broad spectrum sunscreen SPF 30+ to sun-exposed areas, reapply every 2 hours as needed. - Call for any changes   Seborrheic Keratoses - Stuck-on, waxy, tan-brown papules and/or plaques  - Benign-appearing - Discussed benign etiology and prognosis. - Observe - Call for any changes  Hemangiomas - Red papules - Discussed benign nature - Observe - Call for any changes    Return if symptoms worsen or fail to improve.  IMarye Round, CMA, am acting as scribe for Sarina Ser, MD .  Documentation: I have reviewed the above documentation for accuracy and completeness, and I agree with the above.  Sarina Ser, MD

## 2022-08-21 NOTE — Patient Instructions (Signed)
Due to recent changes in healthcare laws, you may see results of your pathology and/or laboratory studies on MyChart before the doctors have had a chance to review them. We understand that in some cases there may be results that are confusing or concerning to you. Please understand that not all results are received at the same time and often the doctors may need to interpret multiple results in order to provide you with the best plan of care or course of treatment. Therefore, we ask that you please give us 2 business days to thoroughly review all your results before contacting the office for clarification. Should we see a critical lab result, you will be contacted sooner.   If You Need Anything After Your Visit  If you have any questions or concerns for your doctor, please call our main line at [phone] and press option 4 to reach your doctor's medical assistant. If no one answers, please leave a voicemail as directed and we will return your call as soon as possible. Messages left after 4 pm will be answered the following business day.   You may also send us a message via MyChart. We typically respond to MyChart messages within 1-2 business days.  For prescription refills, please ask your pharmacy to contact our office. Our fax number is [phone].  If you have an urgent issue when the clinic is closed that cannot wait until the next business day, you can page your doctor at the number below.    Please note that while we do our best to be available for urgent issues outside of office hours, we are not available 24/7.   If you have an urgent issue and are unable to reach us, you may choose to seek medical care at your doctor's office, retail clinic, urgent care center, or emergency room.  If you have a medical emergency, please immediately call 911 or go to the emergency department.  Pager Numbers  - Dr. Kowalski: [phone]  - Dr. Moye: [phone]  - Dr. Stewart:  [phone]  In the event of inclement weather, please call our main line at [phone] for an update on the status of any delays or closures.  Dermatology Medication Tips: Please keep the boxes that topical medications come in in order to help keep track of the instructions about where and how to use these. Pharmacies typically print the medication instructions only on the boxes and not directly on the medication tubes.   If your medication is too expensive, please contact our office at [phone] option 4 or send us a message through MyChart.   We are unable to tell what your co-pay for medications will be in advance as this is different depending on your insurance coverage. However, we may be able to find a substitute medication at lower cost or fill out paperwork to get insurance to cover a needed medication.   If a prior authorization is required to get your medication covered by your insurance company, please allow us 1-2 business days to complete this process.  Drug prices often vary depending on where the prescription is filled and some pharmacies may offer cheaper prices.  The website www.goodrx.com contains coupons for medications through different pharmacies. The prices here do not account for what the cost may be with help from insurance (it may be cheaper with your insurance), but the website can give you the price if you did not use any insurance.  - You can print the associated coupon and take it with   your prescription to the pharmacy.  - You may also stop by our office during regular business hours and pick up a GoodRx coupon card.  - If you need your prescription sent electronically to a different pharmacy, notify our office through [facility] MyChart or by phone at [phone] option 4.     Si Usted Necesita Algo Despus de Su Visita  Tambin puede enviarnos un mensaje a travs de MyChart. Por lo general respondemos a los mensajes de MyChart en el transcurso de 1 a 2  das hbiles.  Para renovar recetas, por favor pida a su farmacia que se ponga en contacto con nuestra oficina. Nuestro nmero de fax es el [phone].  Si tiene un asunto urgente cuando la clnica est cerrada y que no puede esperar hasta el siguiente da hbil, puede llamar/localizar a su doctor(a) al nmero que aparece a continuacin.   Por favor, tenga en cuenta que aunque hacemos todo lo posible para estar disponibles para asuntos urgentes fuera del horario de oficina, no estamos disponibles las 24 horas del da, los 7 das de la semana.   Si tiene un problema urgente y no puede comunicarse con nosotros, puede optar por buscar atencin mdica  en el consultorio de su doctor(a), en una clnica privada, en un centro de atencin urgente o en una sala de emergencias.  Si tiene una emergencia mdica, por favor llame inmediatamente al 911 o vaya a la sala de emergencias.  Nmeros de bper  - Dr. Kowalski: [phone]  - Dra. Moye: [phone]  - Dra. Stewart: [phone]  En caso de inclemencias del tiempo, por favor llame a nuestra lnea principal al [phone] para una actualizacin sobre el estado de cualquier retraso o cierre.  Consejos para la medicacin en dermatologa: Por favor, guarde las cajas en las que vienen los medicamentos de uso tpico para ayudarle a seguir las instrucciones sobre dnde y cmo usarlos. Las farmacias generalmente imprimen las instrucciones del medicamento slo en las cajas y no directamente en los tubos del medicamento.   Si su medicamento es muy caro, por favor, pngase en contacto con nuestra oficina llamando al [phone] y presione la opcin 4 o envenos un mensaje a travs de MyChart.   No podemos decirle cul ser su copago por los medicamentos por adelantado ya que esto es diferente dependiendo de la cobertura de su seguro. Sin embargo, es posible que podamos encontrar un medicamento sustituto a menor costo o llenar un formulario para que el  seguro cubra el medicamento que se considera necesario.   Si se requiere una autorizacin previa para que su compaa de seguros cubra su medicamento, por favor permtanos de 1 a 2 das hbiles para completar este proceso.  Los precios de los medicamentos varan con frecuencia dependiendo del lugar de dnde se surte la receta y alguna farmacias pueden ofrecer precios ms baratos.  El sitio web www.goodrx.com tiene cupones para medicamentos de diferentes farmacias. Los precios aqu no tienen en cuenta lo que podra costar con la ayuda del seguro (puede ser ms barato con su seguro), pero el sitio web puede darle el precio si no utiliz ningn seguro.  - Puede imprimir el cupn correspondiente y llevarlo con su receta a la farmacia.  - Tambin puede pasar por nuestra oficina durante el horario de atencin regular y recoger una tarjeta de cupones de GoodRx.  - Si necesita que su receta se enve electrnicamente a una farmacia diferente, informe a nuestra oficina a travs de MyChart de [place]   o por telfono llamando al [phone] y presione la opcin 4.  

## 2022-09-04 ENCOUNTER — Encounter: Payer: Self-pay | Admitting: Dermatology

## 2022-09-23 ENCOUNTER — Ambulatory Visit
Admission: RE | Admit: 2022-09-23 | Discharge: 2022-09-23 | Disposition: A | Discharge status: Home / Self Care | Payer: Medicare Other | Source: Ambulatory Visit | Attending: Internal Medicine | Admitting: Internal Medicine

## 2022-09-23 DIAGNOSIS — Z1231 Encounter for screening mammogram for malignant neoplasm of breast: Secondary | ICD-10-CM | POA: Diagnosis present

## 2023-08-11 ENCOUNTER — Other Ambulatory Visit: Payer: Self-pay | Admitting: Internal Medicine

## 2023-08-11 DIAGNOSIS — Z1231 Encounter for screening mammogram for malignant neoplasm of breast: Secondary | ICD-10-CM

## 2023-09-25 ENCOUNTER — Ambulatory Visit
Admission: RE | Admit: 2023-09-25 | Discharge: 2023-09-25 | Disposition: A | Discharge status: Home / Self Care | Payer: Medicare Other | Source: Ambulatory Visit | Attending: Internal Medicine | Admitting: Internal Medicine

## 2023-09-25 DIAGNOSIS — Z1231 Encounter for screening mammogram for malignant neoplasm of breast: Secondary | ICD-10-CM | POA: Diagnosis present

## 2024-08-15 ENCOUNTER — Other Ambulatory Visit: Payer: Self-pay | Admitting: Internal Medicine

## 2024-08-15 DIAGNOSIS — Z1231 Encounter for screening mammogram for malignant neoplasm of breast: Secondary | ICD-10-CM

## 2024-09-28 ENCOUNTER — Ambulatory Visit
Admission: RE | Admit: 2024-09-28 | Discharge: 2024-09-28 | Disposition: A | Discharge status: Home / Self Care | Source: Ambulatory Visit | Attending: Internal Medicine | Admitting: Internal Medicine

## 2024-09-28 DIAGNOSIS — Z1231 Encounter for screening mammogram for malignant neoplasm of breast: Secondary | ICD-10-CM | POA: Diagnosis present
